# Patient Record
Sex: Male | Born: 1955 | Race: Black or African American | Hispanic: No | Marital: Married | Smoking: Current every day smoker
Health system: Southern US, Community
[De-identification: ages and names within clinical notes are randomized; demographics above are authoritative.]

---

## 2014-02-14 ENCOUNTER — Ambulatory Visit (INDEPENDENT_AMBULATORY_CARE_PROVIDER_SITE_OTHER): Payer: Federal, State, Local not specified - PPO | Admitting: Family Medicine

## 2014-02-14 ENCOUNTER — Ambulatory Visit (INDEPENDENT_AMBULATORY_CARE_PROVIDER_SITE_OTHER): Payer: Federal, State, Local not specified - PPO

## 2014-02-14 VITALS — BP 88/52 | HR 78 | Temp 98.5°F | Resp 16 | Ht 71.5 in | Wt 206.0 lb

## 2014-02-14 DIAGNOSIS — M545 Low back pain: Secondary | ICD-10-CM

## 2014-02-14 DIAGNOSIS — R0602 Shortness of breath: Secondary | ICD-10-CM

## 2014-02-14 LAB — POCT URINALYSIS DIPSTICK
Bilirubin, UA: NEGATIVE
Blood, UA: NEGATIVE
Glucose, UA: NEGATIVE
Ketones, UA: NEGATIVE
Leukocytes, UA: NEGATIVE
Nitrite, UA: NEGATIVE
Protein, UA: NEGATIVE
Spec Grav, UA: 1.025
Urobilinogen, UA: 0.2
pH, UA: 5.5

## 2014-02-14 LAB — POCT CBC
Granulocyte percent: 64.9 %G (ref 37–80)
HCT, POC: 43.8 % (ref 43.5–53.7)
Hemoglobin: 14.3 g/dL (ref 14.1–18.1)
Lymph, poc: 2.2 (ref 0.6–3.4)
MCH, POC: 30.4 pg (ref 27–31.2)
MCHC: 32.7 g/dL (ref 31.8–35.4)
MCV: 93 fL (ref 80–97)
MID (cbc): 0.4 (ref 0–0.9)
MPV: 6.9 fL (ref 0–99.8)
POC Granulocyte: 4.9 (ref 2–6.9)
POC LYMPH PERCENT: 29.7 %L (ref 10–50)
POC MID %: 5.4 %M (ref 0–12)
Platelet Count, POC: 219 10*3/uL (ref 142–424)
RBC: 4.71 M/uL (ref 4.69–6.13)
RDW, POC: 14.5 %
WBC: 7.5 10*3/uL (ref 4.6–10.2)

## 2014-02-14 MED ORDER — PREDNISONE 20 MG PO TABS: 40.0000 mg | ORAL_TABLET | Freq: Every day | ORAL | Status: AC

## 2014-02-14 MED ORDER — ALBUTEROL SULFATE HFA 108 (90 BASE) MCG/ACT IN AERS: 2.0000 | INHALATION_SPRAY | Freq: Four times a day (QID) | RESPIRATORY_TRACT | Status: AC | PRN

## 2014-02-14 NOTE — Progress Notes (Signed)
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 He's [facility] and had an x-ray but the results of that are pending.  Patient denies any chest pain but does have a cough which is minimally productive. He also has some gassiness upper abdomen.  Patient's had no fever. His appetite is good and his bowel movements are regular. He's had a colonoscopy in 2012.  Patient is a smoker.  Objective: Patient is in no acute distress but I notice that he does be heavily when he moves at all. HEENT: Unremarkable with exception of some dental problems with receding gums. Chest: Decreased breath sounds with no wheezes, rhonchi, or rales Heart: Regular, heart sounds distant, no murmur heard Extremities: No edema Abdomen: Soft nontender with some fullness in the right upper quadrant but no definite mass or HSM.  Spirometry:  WNL  [facility] reading (PRIMARY) by  Dr. Milus GlazierLauenstein:  Mild right perihilar adenopathy.  No infiltrates.  Results for orders placed or performed in visit on 02/14/14  POCT CBC  Result Value Ref Range   WBC 7.5 4.6 - 10.2 K/uL   Lymph, poc 2.2 0.6 - 3.4   POC LYMPH PERCENT 29.7 10 - 50 %L   MID (cbc) 0.4 0 - 0.9   POC MID % 5.4 0 - 12 %M   POC Granulocyte 4.9 2 - 6.9   Granulocyte percent 64.9 37 - 80 %G   RBC 4.71 4.69 - 6.13 M/uL   Hemoglobin 14.3 14.1 - 18.1 g/dL   HCT, POC 14.743.8 82.943.5 - 53.7 %   MCV 93.0 80 - 97 fL   MCH, POC 30.4 27 - 31.2 pg   MCHC 32.7 31.8 - 35.4 g/dL   RDW, POC 56.214.5 %   Platelet Count, POC 219 142 - 424 K/uL   MPV 6.9 0 - 99.8 fL  POCT urinalysis dipstick  Result Value Ref Range   Color, UA yellow    Clarity, UA clear    Glucose, UA neg    Bilirubin, UA neg    Ketones, UA neg    Spec Grav, UA 1.025    Blood, UA neg    pH, UA 5.5    Protein, UA neg    Urobilinogen, UA 0.2    Nitrite, UA neg    Leukocytes, UA Negative    This chart was scribed in my presence and  reviewed by me personally.    ICD-9-CM ICD-10-CM   1. SOB (shortness of breath) 786.05 R06.02 POCT CBC     DG Chest 2 View     predniSONE (DELTASONE) 20 MG tablet     albuterol (PROVENTIL HFA;VENTOLIN HFA) 108 (90 BASE) MCG/ACT inhaler  2. Bilateral low back pain, with sciatica presence unspecified 724.2 M54.5 POCT urinalysis dipstick     Signed, Elvina SidleKurt Arrin Pintor, MD

## 2014-02-14 NOTE — Patient Instructions (Signed)
Please return in one week to reevaluate you've breathing situation.

## 2018-08-14 ENCOUNTER — Emergency Department (HOSPITAL_BASED_OUTPATIENT_CLINIC_OR_DEPARTMENT_OTHER)
Admission: EM | Admit: 2018-08-14 | Discharge: 2018-08-14 | Disposition: A | Discharge status: Home / Self Care | Payer: Federal, State, Local not specified - PPO | Attending: Emergency Medicine | Admitting: Emergency Medicine

## 2018-08-14 ENCOUNTER — Emergency Department (HOSPITAL_BASED_OUTPATIENT_CLINIC_OR_DEPARTMENT_OTHER): Payer: Federal, State, Local not specified - PPO

## 2018-08-14 ENCOUNTER — Encounter (HOSPITAL_BASED_OUTPATIENT_CLINIC_OR_DEPARTMENT_OTHER): Payer: Self-pay | Admitting: Adult Health

## 2018-08-14 ENCOUNTER — Other Ambulatory Visit: Payer: Self-pay

## 2018-08-14 DIAGNOSIS — M5442 Lumbago with sciatica, left side: Secondary | ICD-10-CM | POA: Insufficient documentation

## 2018-08-14 DIAGNOSIS — F17210 Nicotine dependence, cigarettes, uncomplicated: Secondary | ICD-10-CM | POA: Diagnosis not present

## 2018-08-14 DIAGNOSIS — R1032 Left lower quadrant pain: Secondary | ICD-10-CM | POA: Diagnosis present

## 2018-08-14 LAB — COMPREHENSIVE METABOLIC PANEL
ALT: 12 U/L (ref 0–44)
AST: 15 U/L (ref 15–41)
Albumin: 3.6 g/dL (ref 3.5–5.0)
Alkaline Phosphatase: 83 U/L (ref 38–126)
Anion gap: 10 (ref 5–15)
BUN: 17 mg/dL (ref 8–23)
CO2: 23 mmol/L (ref 22–32)
Calcium: 8.7 mg/dL — ABNORMAL LOW (ref 8.9–10.3)
Chloride: 105 mmol/L (ref 98–111)
Creatinine, Ser: 1.23 mg/dL (ref 0.61–1.24)
GFR calc Af Amer: >60 mL/min (ref >60)
GFR calc non Af Amer: >60 mL/min (ref >60)
Glucose, Bld: 103 mg/dL — ABNORMAL HIGH (ref 70–99)
Potassium: 4.1 mmol/L (ref 3.5–5.1)
Sodium: 138 mmol/L (ref 135–145)
Total Bilirubin: 0.3 mg/dL (ref 0.3–1.2)
Total Protein: 6.9 g/dL (ref 6.5–8.1)

## 2018-08-14 LAB — CBC WITH DIFFERENTIAL/PLATELET
Abs Immature Granulocytes: 0.02 10*3/uL (ref 0.00–0.07)
Basophils Absolute: 0.1 10*3/uL (ref 0.0–0.1)
Basophils Relative: 1 %
Eosinophils Absolute: 0.2 10*3/uL (ref 0.0–0.5)
Eosinophils Relative: 4 %
HCT: 42.7 % (ref 39.0–52.0)
Hemoglobin: 13.9 g/dL (ref 13.0–17.0)
Immature Granulocytes: 0 %
Lymphocytes Relative: 37 %
Lymphs Abs: 2.4 10*3/uL (ref 0.7–4.0)
MCH: 30.7 pg (ref 26.0–34.0)
MCHC: 32.6 g/dL (ref 30.0–36.0)
MCV: 94.3 fL (ref 80.0–100.0)
Monocytes Absolute: 0.5 10*3/uL (ref 0.1–1.0)
Monocytes Relative: 7 %
Neutro Abs: 3.3 10*3/uL (ref 1.7–7.7)
Neutrophils Relative %: 51 %
Platelets: 210 10*3/uL (ref 150–400)
RBC: 4.53 MIL/uL (ref 4.22–5.81)
RDW: 13.5 % (ref 11.5–15.5)
WBC: 6.5 10*3/uL (ref 4.0–10.5)
nRBC: 0 % (ref 0.0–0.2)

## 2018-08-14 MED ORDER — SODIUM CHLORIDE 0.9 % IV BOLUS
500.0000 mL | Freq: Once | INTRAVENOUS | Status: AC
  Administered 2018-08-14: 500 mL via INTRAVENOUS

## 2018-08-14 MED ORDER — MORPHINE SULFATE (PF) 4 MG/ML IV SOLN
4.0000 mg | Freq: Once | INTRAVENOUS | Status: AC
  Administered 2018-08-14: 4 mg via INTRAVENOUS
  Filled 2018-08-14: qty 1

## 2018-08-14 MED ORDER — TRAMADOL HCL 50 MG PO TABS: 50.0000 mg | ORAL_TABLET | Freq: Four times a day (QID) | ORAL | 0 refills | Status: AC | PRN

## 2018-08-14 MED ORDER — IOHEXOL 350 MG/ML SOLN
125.0000 mL | Freq: Once | INTRAVENOUS | Status: AC | PRN
  Administered 2018-08-14: 120 mL via INTRAVENOUS

## 2018-08-14 MED ORDER — LIDOCAINE 5 % EX PTCH: 1.0000 | MEDICATED_PATCH | CUTANEOUS | 0 refills | Status: AC

## 2018-08-14 MED ORDER — METHYLPREDNISOLONE 4 MG PO TBPK: ORAL_TABLET | ORAL | 0 refills | Status: AC

## 2018-08-14 MED ORDER — CYCLOBENZAPRINE HCL 10 MG PO TABS: 10.0000 mg | ORAL_TABLET | Freq: Two times a day (BID) | ORAL | 0 refills | Status: AC | PRN

## 2018-08-14 NOTE — ED Notes (Signed)
Pt advised prescriptions can be picked up at pharmacy listed on d/c instructions. His girlfriend is driving him home

## 2018-08-14 NOTE — ED Notes (Signed)
CT waiting on CMP results prior to imaging.

## 2018-08-14 NOTE — ED Notes (Signed)
Irregular rhythm noted of monitor. EDP notified and EKG done per VORB from Dr. Billy Fischer

## 2018-08-14 NOTE — ED Provider Notes (Signed)
[facility] EMERGENCY DEPARTMENT Provider Note   CSN: [id number] Arrival date & time: 08/14/18  1807    History   Chief Complaint Chief Complaint  Patient presents with  . Back Pain    HPI Brian Middleton is a 63 y.o. male.     HPI   Last night was helping a friend work a car, leaned over and suddenly had pain from left flank radiating down the left leg. Goes down back and front of left leg. Moved the wrong way and pain started.  Worse with movements, better with sitting. Tried tylenol, ibuprofen and muscle relaxants without relief.  Numbness in leg, reports entire leg. Pain worse in proximal portion of leg. No weakness.  Thought it was gas at first.  Just had teeth pulled recently.   No abdominal pain, leg hurts when urinating but does not have dysuria or hesitancy.  No loss of control of bowel or bladder.  Has had diarrhea since tooth was pulled Monday because has not been eating normally.  No black or bloody stools.   No hx of pain like this before. No known medical problems.  Smoking cigarettes, considering quitting. No etoh, no other drugs (occ thc)  History reviewed. No pertinent past medical history.  There are no active problems to display for this patient.   History reviewed. No pertinent surgical history.      Home Medications    Prior to Admission medications   Medication Sig Start Date End Date Taking? Authorizing Provider  albuterol (PROVENTIL HFA;VENTOLIN HFA) 108 (90 BASE) MCG/ACT inhaler Inhale 2 puffs into the lungs every 6 (six) hours as needed for wheezing or shortness of breath. 02/14/14   Elvina SidleLauenstein, Kurt, MD  cyclobenzaprine (FLEXERIL) 10 MG tablet Take 1 tablet (10 mg total) by mouth 2 (two) times daily as needed for muscle spasms. 08/14/18   Alvira MondaySchlossman, Dniya Neuhaus, MD  lidocaine (LIDODERM) 5 % Place 1 patch onto the skin daily. Remove & Discard patch within 12 hours or as directed by MD 08/14/18   Alvira MondaySchlossman, Leilyn Frayre, MD  methylPREDNISolone  (MEDROL DOSEPAK) 4 MG TBPK tablet See directions on package 08/14/18   Alvira MondaySchlossman, Daila Elbert, MD  predniSONE (DELTASONE) 20 MG tablet Take 2 tablets (40 mg total) by mouth daily. 02/14/14   Elvina SidleLauenstein, Kurt, MD  traMADol (ULTRAM) 50 MG tablet Take 1 tablet (50 mg total) by mouth every 6 (six) hours as needed. 08/14/18   Alvira MondaySchlossman, Leslieann Whisman, MD    Family History Family History  Problem Relation Age of Onset  . Diabetes Mother     Social History Social History   Tobacco Use  . Smoking status: Current Every Day Smoker    Packs/day: 0.25    Types: Cigarettes  Substance Use Topics  . Alcohol use: No    Alcohol/week: 0.0 standard drinks  . Drug use: No     Allergies   Penicillins   Review of Systems Review of Systems  Constitutional: Negative for fever.  Respiratory: Negative for cough and shortness of breath.   Cardiovascular: Negative for chest pain.  Gastrointestinal: Positive for diarrhea. Negative for abdominal pain, constipation, nausea and vomiting.  Genitourinary: Negative for difficulty urinating and dysuria.  Musculoskeletal: Positive for back pain.  Skin: Negative for rash.  Neurological: Positive for numbness. Negative for weakness.     Physical Exam Updated Vital Signs BP (!) 154/85   Pulse (!) 59   Temp 98.5 F (36.9 C) (Oral)   Resp 16   Ht 6\' 2"  (1.88 m)  Wt 94.3 kg   SpO2 96%   BMI 26.71 kg/m   Physical Exam Vitals signs and nursing note reviewed.  Constitutional:      General: He is not in acute distress.    Appearance: He is well-developed. He is not diaphoretic.  HENT:     Head: Normocephalic and atraumatic.  Eyes:     Conjunctiva/sclera: Conjunctivae normal.  Neck:     Musculoskeletal: Normal range of motion.  Cardiovascular:     Rate and Rhythm: Normal rate and regular rhythm.     Heart sounds: Normal heart sounds. No murmur. No friction rub. No gallop.      Comments: Left DP pulse nonpalpable on initial eval-later eval with more distal DP  pulse palpable but less than right sided Normal bilateral PT pulses Feet warm, normal cap refill Pulmonary:     Effort: Pulmonary effort is normal. No respiratory distress.     Breath sounds: Normal breath sounds. No wheezing or rales.  Abdominal:     General: There is no distension.     Palpations: Abdomen is soft.     Tenderness: There is no abdominal tenderness. There is no guarding.  Skin:    General: Skin is warm and dry.  Neurological:     Mental Status: He is alert and oriented to person, place, and time.     GCS: GCS eye subscore is 4. GCS verbal subscore is 5. GCS motor subscore is 6.     Sensory: Sensation is intact. No sensory deficit.     Motor: Motor function is intact. No weakness.      ED Treatments / Results  Labs (all labs ordered are listed, but only abnormal results are displayed) Labs Reviewed  COMPREHENSIVE METABOLIC PANEL - Abnormal; Notable for the following components:      Result Value   Glucose, Bld 103 (*)    Calcium 8.7 (*)    All other components within normal limits  CBC WITH DIFFERENTIAL/PLATELET    EKG EKG Interpretation  Date/Time:  Saturday August 14 2018 20:09:22 EDT Ventricular Rate:  63 PR Interval:    QRS Duration: 88 QT Interval:  408 QTC Calculation: 418 R Axis:   73 Text Interpretation:  Sinus arrhythmia Borderline prolonged PR interval Consider left ventricular hypertrophy No previous ECGs available Confirmed by Alvira MondaySchlossman, Kaven Cumbie ([id number]) on 08/14/2018 10:14:37 PM   Radiology Ct Angio Aortobifemoral W And/or Wo Contrast  Result Date: 08/14/2018 CLINICAL DATA:  63 year old male with 7 onset of left leg pain and coldness. Also left flank pain. EXAM: CT ANGIOGRAPHY OF ABDOMINAL AORTA WITH ILIOFEMORAL RUNOFF TECHNIQUE: Multidetector CT imaging of the abdomen, pelvis and lower extremities was performed using the standard protocol during bolus administration of intravenous contrast. Multiplanar CT image reconstructions and MIPs were  obtained to evaluate the vascular anatomy. CONTRAST:  120mL OMNIPAQUE IOHEXOL 350 MG/ML SOLN COMPARISON:  None. FINDINGS: VASCULAR Aorta: Normal caliber aorta without aneurysm, dissection, vasculitis or significant stenosis. Celiac: Patent without evidence of aneurysm, dissection, vasculitis or significant stenosis. There is an accessory left hepatic artery from left gastric artery. SMA: Patent without evidence of aneurysm, dissection, vasculitis or significant stenosis. There is an accessory right hepatic artery from the SMA. Renals: Both renal arteries are patent without evidence of aneurysm, dissection, vasculitis, fibromuscular dysplasia or significant stenosis. IMA: Patent without evidence of aneurysm, dissection, vasculitis or significant stenosis. RIGHT Lower Extremity Inflow: Mild atherosclerotic calcification of the iliac vessels. The internal and external iliac arteries as well as the common iliac  artery are patent. Outflow: The common femoral artery, superficial and deep femoral arteries are patent. There is slight irregularity of the distal superficial femoral artery which may represent mild noncalcified plaque. The vessel is patent. The popliteal artery and its trifurcation are patent. Runoff: Patent three vessel runoff to the ankle. The plantar artery and dorsalis pedis artery are patent. LEFT Lower Extremity Inflow: Mild atherosclerotic plaques. The common, internal, and external iliac arteries are patent. Outflow: The common, deep, and superficial femoral arteries patent. There is irregularity of the distal superficial femoral artery which may represent mild plaque. The vessel remains patent. The popliteal artery and its trifurcation are patent. Runoff: Patent three vessel runoff to the ankle. The plantar artery is patent. There appears to be diminished flow in the left dorsalis pedis artery. Evaluation of the arteries of the foot is however limited on this study. Veins: No obvious venous abnormality  within the limitations of this arterial phase study. Review of the MIP images confirms the above findings. NON-VASCULAR Lower chest: The visualized lung bases are clear. No intra-abdominal free air or free fluid. Hepatobiliary: No focal liver abnormality is seen. No gallstones, gallbladder wall thickening, or biliary dilatation. Pancreas: Unremarkable. No pancreatic ductal dilatation or surrounding inflammatory changes. Spleen: Normal in size without focal abnormality. Adrenals/Urinary Tract: The adrenal glands are unremarkable. There is no hydronephrosis on either side. Subcentimeter hypodense lesion in the inferior pole of the right kidney is not characterized. The visualized ureters and urinary bladder appear unremarkable. Stomach/Bowel: There is sigmoid diverticulosis without active inflammatory changes. There is no bowel obstruction or active inflammation. The appendix is normal. Lymphatic: No adenopathy. Reproductive: Mild enlargement of the prostate gland. The seminal vesicles are symmetric. Other: There is thickened appearance of the soft tissues of the penis. Clinical correlation is recommended to evaluate for an inflammatory or infectious process. Musculoskeletal: Mild degenerative changes of the spine as well as degenerative changes of the first MTP joint bilaterally. No acute osseous pathology. IMPRESSION: 1. Patent three-vessel runoff to the bilateral ankles. There appears to be diminished flow in the left dorsalis pedis artery. Evaluation of the arteries of the foot is however limited on this study. Clinical correlation is recommended. 2. No acute intra-abdominal or pelvic pathology. No CT evidence of aortic dissection or aneurysm. 3. Thickened appearance of the soft tissues of the penis. Clinical correlation is recommended to evaluate for an inflammatory or infectious process. 4. Sigmoid diverticulosis. No bowel obstruction or active inflammation. Normal appendix. Electronically Signed   By: Elgie CollardArash   Radparvar M.D.   On: 08/14/2018 22:09    Procedures Procedures (including critical care time)  Medications Ordered in ED Medications  sodium chloride 0.9 % bolus 500 mL (0 mLs Intravenous Stopped 08/14/18 2000)  morphine 4 MG/ML injection 4 mg (4 mg Intravenous Given 08/14/18 1929)  iohexol (OMNIPAQUE) 350 MG/ML injection 125 mL (120 mLs Intravenous Contrast Given 08/14/18 2032)  morphine 4 MG/ML injection 4 mg (4 mg Intravenous Given 08/14/18 2110)     Initial Impression / Assessment and Plan / ED Course  I have reviewed the triage vital signs and the nursing notes.  Pertinent labs & imaging results that were available during my care of the patient were reviewed by me and considered in my medical decision making (see chart for details).        63yo male with history of smoking presents with left flank/lower back pain with radiation down left leg and left leg numbness. On my initial eval, patient rocking back and  forth with colicky appearing pain, unable to palpate left sided DP (although foot appears perfused) and given this presentation ordered CTA to evaluate for signs of dissection and LLE ischemia.   CTA shows no sign of dissection, normal 3 vessel run off with decrease in flow in the left DP artery.  Discussed with vascular surgery. Pt with normal perfusion to foot, palpable PT pulse, no specific foot pain, does not require vascular surgery follow up. On reeeval, able to palpate more distal DP pulse, although less than right side, and suspect this may be his baseline.  CT shows thickened appearance of soft tissues of penis, pt denies any concerns and declines exam.    Regarding back pain, in light of CT, hx and physical, suspect likely disc herniation as etiology of pain. Reports normal sensation on my exam, has good strength. Denies any urinary retention or overflow incontinence, stool incontinence, saddle anesthesia, fever, IV drug use, trauma, and have low suspicion suspicion for  cauda equina, fracture, epidural abscess, or vertebral osteomyelitis.  Given rx medrol dose pack, flexeril, tramadol, lidocaine patch and recommend ibuprofen/tylenol OTC.  Reviewed in [place] drug database, did have recent rx for 3 days of hydrocodone for wrist pain, however given severity of pain at this time, feel rx for tramadol is reasonable. Discussed risks in detail. Recommend PCP follow up for PT and further pain control. Patient discharged in stable condition with understanding of reasons to return.   Final Clinical Impressions(s) / ED Diagnoses   Final diagnoses:  Acute left-sided low back pain with left-sided sciatica    ED Discharge Orders         Ordered    methylPREDNISolone (MEDROL DOSEPAK) 4 MG TBPK tablet     08/14/18 2310    traMADol (ULTRAM) 50 MG tablet  Every 6 hours PRN     08/14/18 2310    cyclobenzaprine (FLEXERIL) 10 MG tablet  2 times daily PRN     08/14/18 2310    lidocaine (LIDODERM) 5 %  Every 24 hours     08/14/18 2310           Gareth Morgan, MD 08/15/18 1336

## 2018-08-14 NOTE — ED Notes (Signed)
ED Provider at bedside. 

## 2018-08-14 NOTE — ED Triage Notes (Signed)
PREsents with one day of left flank pain that radiates into the left leg and is described as aching and numbness. The pain began last night after moving the wrong way. He has no relief with ibuprofen and muscle relaxers

## 2018-08-14 NOTE — ED Notes (Signed)
Pt given urinal.

## 2018-10-08 ENCOUNTER — Emergency Department (HOSPITAL_BASED_OUTPATIENT_CLINIC_OR_DEPARTMENT_OTHER)
Admission: EM | Admit: 2018-10-08 | Discharge: 2018-10-08 | Disposition: A | Discharge status: Home / Self Care | Payer: Federal, State, Local not specified - PPO | Attending: Emergency Medicine | Admitting: Emergency Medicine

## 2018-10-08 ENCOUNTER — Other Ambulatory Visit: Payer: Self-pay

## 2018-10-08 ENCOUNTER — Encounter (HOSPITAL_BASED_OUTPATIENT_CLINIC_OR_DEPARTMENT_OTHER): Payer: Self-pay

## 2018-10-08 ENCOUNTER — Emergency Department (HOSPITAL_BASED_OUTPATIENT_CLINIC_OR_DEPARTMENT_OTHER): Payer: Federal, State, Local not specified - PPO

## 2018-10-08 DIAGNOSIS — Y92009 Unspecified place in unspecified non-institutional (private) residence as the place of occurrence of the external cause: Secondary | ICD-10-CM | POA: Insufficient documentation

## 2018-10-08 DIAGNOSIS — F17210 Nicotine dependence, cigarettes, uncomplicated: Secondary | ICD-10-CM | POA: Insufficient documentation

## 2018-10-08 DIAGNOSIS — R070 Pain in throat: Secondary | ICD-10-CM | POA: Diagnosis present

## 2018-10-08 DIAGNOSIS — Y9389 Activity, other specified: Secondary | ICD-10-CM | POA: Diagnosis not present

## 2018-10-08 DIAGNOSIS — X58XXXA Exposure to other specified factors, initial encounter: Secondary | ICD-10-CM | POA: Diagnosis not present

## 2018-10-08 DIAGNOSIS — Z79899 Other long term (current) drug therapy: Secondary | ICD-10-CM | POA: Insufficient documentation

## 2018-10-08 DIAGNOSIS — Y999 Unspecified external cause status: Secondary | ICD-10-CM | POA: Insufficient documentation

## 2018-10-08 DIAGNOSIS — T17208A Unspecified foreign body in pharynx causing other injury, initial encounter: Secondary | ICD-10-CM

## 2018-10-08 DIAGNOSIS — Z20828 Contact with and (suspected) exposure to other viral communicable diseases: Secondary | ICD-10-CM | POA: Insufficient documentation

## 2018-10-08 LAB — CBC WITH DIFFERENTIAL/PLATELET
Abs Immature Granulocytes: 0.02 10*3/uL (ref 0.00–0.07)
Basophils Absolute: 0.1 10*3/uL (ref 0.0–0.1)
Basophils Relative: 1 %
Eosinophils Absolute: 0.3 10*3/uL (ref 0.0–0.5)
Eosinophils Relative: 4 %
HCT: 41.6 % (ref 39.0–52.0)
Hemoglobin: 13.7 g/dL (ref 13.0–17.0)
Immature Granulocytes: 0 %
Lymphocytes Relative: 33 %
Lymphs Abs: 2.4 10*3/uL (ref 0.7–4.0)
MCH: 30.9 pg (ref 26.0–34.0)
MCHC: 32.9 g/dL (ref 30.0–36.0)
MCV: 93.7 fL (ref 80.0–100.0)
Monocytes Absolute: 0.7 10*3/uL (ref 0.1–1.0)
Monocytes Relative: 10 %
Neutro Abs: 3.7 10*3/uL (ref 1.7–7.7)
Neutrophils Relative %: 52 %
Platelets: 228 10*3/uL (ref 150–400)
RBC: 4.44 MIL/uL (ref 4.22–5.81)
RDW: 13.5 % (ref 11.5–15.5)
WBC: 7.1 10*3/uL (ref 4.0–10.5)
nRBC: 0 % (ref 0.0–0.2)

## 2018-10-08 LAB — COMPREHENSIVE METABOLIC PANEL
ALT: 12 U/L (ref 0–44)
AST: 18 U/L (ref 15–41)
Albumin: 3.8 g/dL (ref 3.5–5.0)
Alkaline Phosphatase: 89 U/L (ref 38–126)
Anion gap: 7 (ref 5–15)
BUN: 20 mg/dL (ref 8–23)
CO2: 24 mmol/L (ref 22–32)
Calcium: 8.9 mg/dL (ref 8.9–10.3)
Chloride: 108 mmol/L (ref 98–111)
Creatinine, Ser: 1 mg/dL (ref 0.61–1.24)
GFR calc Af Amer: >60 mL/min (ref >60)
GFR calc non Af Amer: >60 mL/min (ref >60)
Glucose, Bld: 90 mg/dL (ref 70–99)
Potassium: 3.7 mmol/L (ref 3.5–5.1)
Sodium: 139 mmol/L (ref 135–145)
Total Bilirubin: 0.2 mg/dL — ABNORMAL LOW (ref 0.3–1.2)
Total Protein: 7.2 g/dL (ref 6.5–8.1)

## 2018-10-08 LAB — SARS CORONAVIRUS 2 BY RT PCR (HOSPITAL ORDER, PERFORMED IN ~~LOC~~ HOSPITAL LAB): SARS Coronavirus 2: NEGATIVE

## 2018-10-08 MED ORDER — IOHEXOL 300 MG/ML  SOLN
100.0000 mL | Freq: Once | INTRAMUSCULAR | Status: AC | PRN
  Administered 2018-10-08: 75 mL via INTRAVENOUS

## 2018-10-08 NOTE — ED Provider Notes (Signed)
Patient here transferred from [facility] for evaluation of retained foreign body.  Symptoms began yesterday after eating a chicken wing.  Since then has had left-sided neck discomfort and swelling, difficulty swallowing, drooling due to discomfort.  Denies shortness of breath or chest pains.  CT scan today shows possible tiny 3 mm retained chicken bone/foreign body in the right vallecula but otherwise negative.  ENT was consulted and the patient was transferred for further evaluation. Physical Exam  BP 125/77   Pulse 79   Temp 98.3 F (36.8 C) (Oral)   Resp 16   Ht 6\' 2"  (1.88 m)   Wt 90.7 kg   SpO2 96%   BMI 25.68 kg/m   Physical Exam Vitals signs and nursing note reviewed.  Constitutional:      General: He is not in acute distress.    Appearance: He is well-developed.  HENT:     Head: Normocephalic and atraumatic.     Mouth/Throat:     Comments: Tolerating secretions without difficulty.  No abnormal phonation.  No trismus.  Unable to visualize any abnormalities in the posterior oropharynx. Eyes:     General:        Right eye: No discharge.        Left eye: No discharge.     Conjunctiva/sclera: Conjunctivae normal.  Neck:     Musculoskeletal: Neck supple. Muscular tenderness present.     Vascular: No JVD.     Trachea: No tracheal deviation.     Comments: Mild left-sided neck tenderness. Cardiovascular:     Rate and Rhythm: Normal rate and regular rhythm.  Pulmonary:     Effort: Pulmonary effort is normal.     Breath sounds: Normal breath sounds.  Abdominal:     General: There is no distension.  Skin:    General: Skin is warm and dry.     Findings: No erythema.  Neurological:     Mental Status: He is alert.  Psychiatric:        Behavior: Behavior normal.    MDM  Spoke with Dr. Janace Hoard who will see and evaluate the patient emergently in the ED.  11:15PM Dr. Janace Hoard has examined the patient. Examination is reassuring and there is no indication for emergent  operative procedure at this time.  Likely has some localized irritation.  He will follow-up in the office in the next 1 to 2 days if symptoms persist, sooner if symptoms worsen.  He is tolerating sips of fluid in the ED.  Tolerating secretions without difficulty discussed strict ED return precautions.  Patient and significant other verbalized understanding of and agreement with plan and patient stable for discharge home at this time       Debroah Baller 10/08/18 2329    Virgel Manifold, MD 10/10/18 [phone]

## 2018-10-08 NOTE — Discharge Instructions (Addendum)
You were seen today by the ENT specialist Dr. Janace Hoard in the emergency department.  Your physical examination was reassuring and there does not appear to be any need to retrieve any foreign bodies in your throat today.  You can take 1 to 2 tablets of Tylenol (350mg -1000mg  depending on the dose) every 6 hours as needed for pain.  Do not exceed 4000 mg of Tylenol daily.  If your pain persists you can take a doses of ibuprofen in between doses of Tylenol.  I usually recommend 400 to 600 mg of ibuprofen every 6 hours.  Take this with food to avoid upset stomach issues.  Drink plenty fluids and get plenty of rest.  Eat a diet of warm soft foods for the next few days.  Follow-up with Dr. Janace Hoard in the office in the next 48 hours or so if your symptoms do not improve.  Return to the emergency department if any concerning signs or symptoms develop such as high fevers, persistent vomiting, severe pain, difficulty breathing or swallowing.

## 2018-10-08 NOTE — ED Notes (Signed)
Discharge instructions and follow up care discussed with pt and significant other at bedside. Both verbalized understanding with no questions at this time.

## 2018-10-08 NOTE — ED Provider Notes (Signed)
[facility] EMERGENCY DEPARTMENT Provider Note   CSN: [id number] Arrival date & time: 10/08/18  1459     History   Chief Complaint Chief Complaint  Patient presents with   Neck swelling    HPI Brian Middleton is a 63 y.o. male.     HPI   63 year old male with no significant medical history presents with concern for left throat pain and swelling after eating chicken last night.  Reports eating chicken with bones in it last night, immediately felt pain in the back of his throat, however it was not severe, and took some ibuprofen and went to bed.  When he woke up this morning, left-sided neck pain and swelling was worse.  Reports significant pain with swallowing on the left side.  Reports it is difficult to swallow, does not have significant drooling.  Reports he has no shortness of breath.  History reviewed. No pertinent past medical history.  There are no active problems to display for this patient.   History reviewed. No pertinent surgical history.      Home Medications    Prior to Admission medications   Medication Sig Start Date End Date Taking? Authorizing Provider  albuterol (PROVENTIL HFA;VENTOLIN HFA) 108 (90 BASE) MCG/ACT inhaler Inhale 2 puffs into the lungs every 6 (six) hours as needed for wheezing or shortness of breath. 02/14/14   Elvina SidleLauenstein, Kurt, MD  cyclobenzaprine (FLEXERIL) 10 MG tablet Take 1 tablet (10 mg total) by mouth 2 (two) times daily as needed for muscle spasms. 08/14/18   Alvira MondaySchlossman, Rodney Yera, MD  lidocaine (LIDODERM) 5 % Place 1 patch onto the skin daily. Remove & Discard patch within 12 hours or as directed by MD 08/14/18   Alvira MondaySchlossman, Tremain Rucinski, MD  methylPREDNISolone (MEDROL DOSEPAK) 4 MG TBPK tablet See directions on package 08/14/18   Alvira MondaySchlossman, Lannie Heaps, MD  predniSONE (DELTASONE) 20 MG tablet Take 2 tablets (40 mg total) by mouth daily. 02/14/14   Elvina SidleLauenstein, Kurt, MD  traMADol (ULTRAM) 50 MG tablet Take 1 tablet (50 mg total) by mouth every 6  (six) hours as needed. 08/14/18   Alvira MondaySchlossman, Sheena Donegan, MD    Family History Family History  Problem Relation Age of Onset   Diabetes Mother     Social History Social History   Tobacco Use   Smoking status: Current Every Day Smoker    Packs/day: 0.25    Types: Cigarettes   Smokeless tobacco: Never Used  Substance Use Topics   Alcohol use: No    Alcohol/week: 0.0 standard drinks   Drug use: No     Allergies   Penicillins   Review of Systems Review of Systems  Constitutional: Negative for fever.  HENT: Positive for sore throat and trouble swallowing. Negative for voice change.   Eyes: Negative for visual disturbance.  Respiratory: Negative for shortness of breath.   Cardiovascular: Negative for chest pain.  Gastrointestinal: Negative for abdominal pain.  Genitourinary: Negative for difficulty urinating.  Musculoskeletal: Positive for neck pain.  Skin: Negative for rash.  Neurological: Negative for syncope and headaches.     Physical Exam Updated Vital Signs BP 126/83    Pulse 63    Temp 98.3 F (36.8 C) (Oral)    Resp 19    Ht 6\' 2"  (1.88 m)    Wt 90.7 kg    SpO2 99%    BMI 25.68 kg/m   Physical Exam Vitals signs and nursing note reviewed.  Constitutional:      General: He is not in acute  distress.    Appearance: He is well-developed. He is not diaphoretic.  HENT:     Head: Normocephalic and atraumatic.     Mouth/Throat:     Mouth: Mucous membranes are moist.     Pharynx: No oropharyngeal exudate or posterior oropharyngeal erythema.  Eyes:     Conjunctiva/sclera: Conjunctivae normal.  Neck:     Musculoskeletal: Normal range of motion. Muscular tenderness (left anterior cervical area, lateral to trachea with tenderness, no crepitus, no significant swelling) present.  Cardiovascular:     Rate and Rhythm: Normal rate and regular rhythm.  Pulmonary:     Effort: Pulmonary effort is normal. No respiratory distress.  Skin:    General: Skin is warm and dry.    Neurological:     Mental Status: He is alert and oriented to person, place, and time.      ED Treatments / Results  Labs (all labs ordered are listed, but only abnormal results are displayed) Labs Reviewed  COMPREHENSIVE METABOLIC PANEL - Abnormal; Notable for the following components:      Result Value   Total Bilirubin 0.2 (*)    All other components within normal limits  SARS CORONAVIRUS 2 (HOSPITAL ORDER, PERFORMED IN [facility])  CBC WITH DIFFERENTIAL/PLATELET    EKG None  Radiology Ct Soft Tissue Neck W Contrast  Result Date: 10/08/2018 CLINICAL DATA:  63 year old male with severe left neck pain after swallowing a chicken bone. EXAM: CT NECK WITH CONTRAST TECHNIQUE: Multidetector CT imaging of the neck was performed using the standard protocol following the bolus administration of intravenous contrast. CONTRAST:  51mL OMNIPAQUE IOHEXOL 300 MG/ML  SOLN COMPARISON:  Report of chest CTA 03/26/2014 (no images available). FINDINGS: Pharynx and larynx: Negative larynx. Pharyngeal soft tissue contours are also within normal limits. Parapharyngeal and retropharyngeal spaces are within normal limits. To possible tiny 3 millimeter linear retained foreign body in the right vallecula on series 3, image 80 and sagittal image 60. But no other radiopaque foreign body identified. Salivary glands: Negative sublingual space. Submandibular glands and parotid glands are within normal limits. Thyroid: Negative. Lymph nodes: An area of clinical concern is marked along the left neck on series 3, image 78. No regional mass or inflammation. No cervical lymphadenopathy, bilateral lymph nodes are symmetric and within normal limits. Vascular: Suboptimal intravascular contrast bolus but the major vascular structures in the neck and at the skull base appear patent. Limited intracranial: Negative. Visualized orbits: Negative. Mastoids and visualized paranasal sinuses: Clear. Skeleton: Absent  dentition. Cervical spine degeneration. No acute osseous abnormality identified. Upper chest: Mild upper lobe scarring and atelectasis. Negative visible mediastinum. Negative visible thoracic esophagus. Normal visible axillary lymph nodes. IMPRESSION: 1. Possible tiny 3 mm retained chicken bone/foreign body in the right vallecula (see sagittal image 60). 2. But no other radiopaque foreign body identified. And negative Neck CT otherwise. Electronically Signed   By: Odessa Fleming M.D.   On: 10/08/2018 18:02    Procedures Procedures (including critical care time)  Medications Ordered in ED Medications  iohexol (OMNIPAQUE) 300 MG/ML solution 100 mL (75 mLs Intravenous Contrast Given 10/08/18 1736)     Initial Impression / Assessment and Plan / ED Course  I have reviewed the triage vital signs and the nursing notes.  Pertinent labs & imaging results that were available during my care of the patient were reviewed by me and considered in my medical decision making (see chart for details).        63 year old male with  no significant medical history presents with concern for left throat pain and swelling after eating chicken last night.  Patient with severe tenderness on exam, CT neck was obtained which showed suspected 3 mm foreign body in the vallecula.  Consulted Dr. Janace Hoard, ENT, we discussed options including transfer for further evaluation, or continued close monitoring with return in 24 hours if not improved, or follow-up with ear nose and throat on Monday if symptoms not worsening.  Discussed options with patient, who reports that due to the pain, and desire for further evaluation, we will transfer to [facility] for ENT eval.  Final Clinical Impressions(s) / ED Diagnoses   Final diagnoses:  Foreign body in pharynx, initial encounter    ED Discharge Orders    None       Gareth Morgan, MD 10/09/18 0028

## 2018-10-08 NOTE — ED Triage Notes (Signed)
Pt c/o swelling to left side of neck that started yesterday am after eating chicken the night before that he felt scraped the side of his throat-NAD-steady gait

## 2018-10-08 NOTE — ED Notes (Signed)
Patient transported to CT 

## 2018-10-08 NOTE — ED Notes (Signed)
ED Provider at bedside. 

## 2018-10-08 NOTE — Consult Note (Signed)
Reason for Consult:FB pharynx Referring Physician: er  Brian Middleton is an 63 y.o. male.  HPI: hx of eating chicken on Wednesday and started having pain yesterday. He has pain in the left upper throat.He did not feel a pain at the time of eating the chicken only 36-48 hours later  He can still swallow. He had CT scan in [facility] and had a possibloe 3 mm FB in the right base of tongue/vallecula.   History reviewed. No pertinent past medical history.  History reviewed. No pertinent surgical history.  Family History  Problem Relation Age of Onset  . Diabetes Mother     Social History:  reports that he has been smoking cigarettes. He has been smoking about 0.25 packs per day. He has never used smokeless tobacco. He reports that he does not drink alcohol or use drugs.  Allergies:  Allergies  Allergen Reactions  . Penicillins Hives    Medications: I have reviewed the patient's current medications.  Results for orders placed or performed during the hospital encounter of 10/08/18 (from the past 48 hour(s))  CBC with Differential     Status: None   Collection Time: 10/08/18  4:28 PM  Result Value Ref Range   WBC 7.1 4.0 - 10.5 K/uL   RBC 4.44 4.22 - 5.81 MIL/uL   Hemoglobin 13.7 13.0 - 17.0 g/dL   HCT 24.8 25.0 - 03.7 %   MCV 93.7 80.0 - 100.0 fL   MCH 30.9 26.0 - 34.0 pg   MCHC 32.9 30.0 - 36.0 g/dL   RDW 04.8 88.9 - 16.9 %   Platelets 228 150 - 400 K/uL   nRBC 0.0 0.0 - 0.2 %   Neutrophils Relative % 52 %   Neutro Abs 3.7 1.7 - 7.7 K/uL   Lymphocytes Relative 33 %   Lymphs Abs 2.4 0.7 - 4.0 K/uL   Monocytes Relative 10 %   Monocytes Absolute 0.7 0.1 - 1.0 K/uL   Eosinophils Relative 4 %   Eosinophils Absolute 0.3 0.0 - 0.5 K/uL   Basophils Relative 1 %   Basophils Absolute 0.1 0.0 - 0.1 K/uL   Immature Granulocytes 0 %   Abs Immature Granulocytes 0.02 0.00 - 0.07 K/uL    Comment: Performed at [facility], [address] [place], [place] [address]   Comprehensive metabolic panel     Status: Abnormal   Collection Time: 10/08/18  4:28 PM  Result Value Ref Range   Sodium 139 135 - 145 mmol/L   Potassium 3.7 3.5 - 5.1 mmol/L   Chloride 108 98 - 111 mmol/L   CO2 24 22 - 32 mmol/L   Glucose, Bld 90 70 - 99 mg/dL   BUN 20 8 - 23 mg/dL   Creatinine, Ser 8.82 0.61 - 1.24 mg/dL   Calcium 8.9 8.9 - 80.0 mg/dL   Total Protein 7.2 6.5 - 8.1 g/dL   Albumin 3.8 3.5 - 5.0 g/dL   AST 18 15 - 41 U/L   ALT 12 0 - 44 U/L   Alkaline Phosphatase 89 38 - 126 U/L   Total Bilirubin 0.2 (L) 0.3 - 1.2 mg/dL   GFR calc non Af Amer >60 >60 mL/min   GFR calc Af Amer >60 >60 mL/min   Anion gap 7 5 - 15    Comment: Performed at [facility], [address] [place], [place] [address]  SARS Coronavirus 2 [facility] order, Performed in [facility] hospital lab) Nasopharyngeal Nasopharyngeal  Swab     Status: None   Collection Time: 10/08/18  6:45 PM   Specimen: Nasopharyngeal Swab  Result Value Ref Range   SARS Coronavirus 2 NEGATIVE NEGATIVE    Comment: (NOTE) If result is NEGATIVE SARS-CoV-2 target nucleic acids are NOT DETECTED. The SARS-CoV-2 RNA is generally detectable in upper and lower  respiratory specimens during the acute phase of infection. The lowest  concentration of SARS-CoV-2 viral copies this assay can detect is 250  copies / mL. A negative result does not preclude SARS-CoV-2 infection  and should not be used as the sole basis for treatment or other  patient management decisions.  A negative result may occur with  improper specimen collection / handling, submission of specimen other  than nasopharyngeal swab, presence of viral mutation(s) within the  areas targeted by this assay, and inadequate number of viral copies  (<250 copies / mL). A negative result must be combined with clinical  observations, patient history, and epidemiological information. If result is POSITIVE SARS-CoV-2 target nucleic acids are DETECTED. The  SARS-CoV-2 RNA is generally detectable in upper and lower  respiratory specimens dur ing the acute phase of infection.  Positive  results are indicative of active infection with SARS-CoV-2.  Clinical  correlation with patient history and other diagnostic information is  necessary to determine patient infection status.  Positive results do  not rule out bacterial infection or co-infection with other viruses. If result is PRESUMPTIVE POSTIVE SARS-CoV-2 nucleic acids MAY BE PRESENT.   A presumptive positive result was obtained on the submitted specimen  and confirmed on repeat testing.  While 2019 novel coronavirus  (SARS-CoV-2) nucleic acids may be present in the submitted sample  additional confirmatory testing may be necessary for epidemiological  and / or clinical management purposes  to differentiate between  SARS-CoV-2 and other Sarbecovirus currently known to infect humans.  If clinically indicated additional testing with an alternate test  methodology [phone]) is advised. The SARS-CoV-2 RNA is generally  detectable in upper and lower respiratory sp ecimens during the acute  phase of infection. The expected result is Negative. Fact Sheet for Patients:  StrictlyIdeas.no Fact Sheet for Healthcare Providers: BankingDealers.co.za This test is not yet approved or cleared by the [place] FDA and has been authorized for detection and/or diagnosis of SARS-CoV-2 by FDA under an Emergency Use Authorization (EUA).  This EUA will remain in effect (meaning this test can be used) for the duration of the COVID-19 declaration under Section 564(b)(1) of the Act, 21 U.S.C. section 360bbb-3(b)(1), unless the authorization is terminated or revoked sooner. Performed at [facility], [address] [place], [place] [address]     Ct Soft Tissue Neck W Contrast  Result Date: 10/08/2018 CLINICAL DATA:  63 year old male with severe left  neck pain after swallowing a chicken bone. EXAM: CT NECK WITH CONTRAST TECHNIQUE: Multidetector CT imaging of the neck was performed using the standard protocol following the bolus administration of intravenous contrast. CONTRAST:  4mL OMNIPAQUE IOHEXOL 300 MG/ML  SOLN COMPARISON:  Report of chest CTA 03/26/2014 (no images available). FINDINGS: Pharynx and larynx: Negative larynx. Pharyngeal soft tissue contours are also within normal limits. Parapharyngeal and retropharyngeal spaces are within normal limits. To possible tiny 3 millimeter linear retained foreign body in the right vallecula on series 3, image 80 and sagittal image 60. But no other radiopaque foreign body identified. Salivary glands: Negative sublingual space. Submandibular glands and parotid glands are within normal limits. Thyroid: Negative. Lymph  nodes: An area of clinical concern is marked along the left neck on series 3, image 78. No regional mass or inflammation. No cervical lymphadenopathy, bilateral lymph nodes are symmetric and within normal limits. Vascular: Suboptimal intravascular contrast bolus but the major vascular structures in the neck and at the skull base appear patent. Limited intracranial: Negative. Visualized orbits: Negative. Mastoids and visualized paranasal sinuses: Clear. Skeleton: Absent dentition. Cervical spine degeneration. No acute osseous abnormality identified. Upper chest: Mild upper lobe scarring and atelectasis. Negative visible mediastinum. Negative visible thoracic esophagus. Normal visible axillary lymph nodes. IMPRESSION: 1. Possible tiny 3 mm retained chicken bone/foreign body in the right vallecula (see sagittal image 60). 2. But no other radiopaque foreign body identified. And negative Neck CT otherwise. Electronically Signed   By: Odessa FlemingH  Hall M.D.   On: 10/08/2018 18:02    ROS Blood pressure 123/83, pulse 62, temperature 98.3 F (36.8 C), temperature source Oral, resp. rate 14, height 6\' 2"  (1.88 m),  weight 90.7 kg, SpO2 99 %. Physical Exam  Constitutional: He appears well-developed and well-nourished.  HENT:  Head: Normocephalic and atraumatic.  Nose: Nose normal.  Mouth/Throat: Oropharynx is clear and moist.  No distress . He has no foreign body or irritation in the OP/OC. FOE- no evidence of mass.lesion or FB . He pushed in the tonsil area where he is hurting a no evidence of FB.stuck his tongue out and  No vallecular FB or irritation  Eyes: Pupils are equal, round, and reactive to light. Conjunctivae are normal.  Neck: Normal range of motion. Neck supple.    Assessment/Plan: Throat pain- he started hurting over 24 hours after eating the chicken. I see no evidence of FB on FOE. The pain he has is on the opposite side of the CT scan findings. I think likely he scrapped the throat with FB and now has irritation. He will follow up in 1-2 days if worse sooner if not improving in 24 hours  Brian ObeyJohn Wynn Middleton 10/08/2018, 11:03 PM

## 2018-10-08 NOTE — ED Triage Notes (Signed)
Pt is a transfer from [facility] for evaluation of  Bone in gland.

## 2020-08-14 IMAGING — CT CT ANGIOGRAPHY AOBIFEM WITHOUT AND WITH CONTRAST
2 of 13 series · 11 of 46 positions shown, 14 images · IV contrast (APPLIED)
Comparison: None.

CLINICAL DATA: 63-year-old male with 7 onset of left leg pain and
coldness. Also left flank pain.

EXAM:
CT ANGIOGRAPHY OF ABDOMINAL AORTA WITH ILIOFEMORAL RUNOFF
TECHNIQUE: Multidetector CT imaging of the abdomen, pelvis and lower
extremities was performed using the standard protocol during bolus
administration of intravenous contrast. Multiplanar CT image
reconstructions and MIPs were obtained to evaluate the vascular
anatomy.
CONTRAST:  120mL OMNIPAQUE IOHEXOL 350 MG/ML SOLN

[Series 5: runoff axial arterial · axial · arterial · 0.97mm/px · z∈[-211,+968]mm · 10 of 473 slices shown, 13 images]
[im 53/473  soft-tissue]
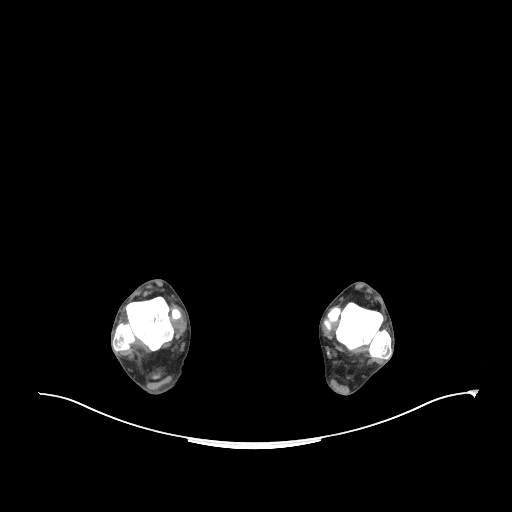
[im 53/473  bone]
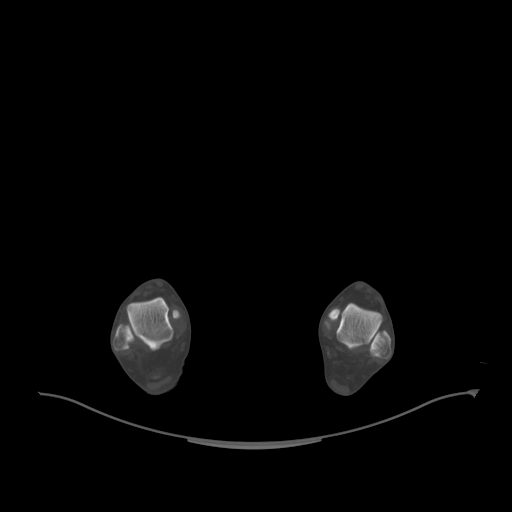
[im 105/473  soft-tissue]
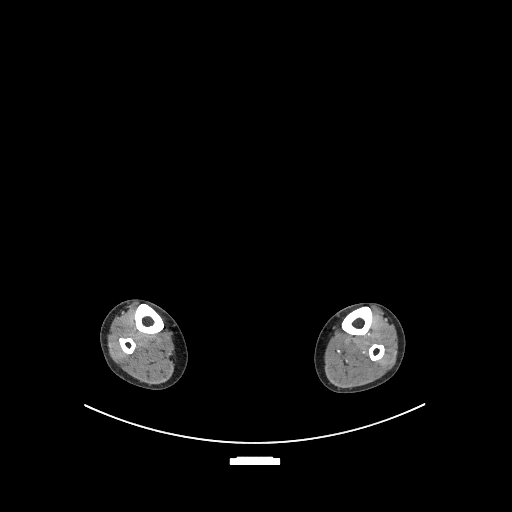
[im 158/473  soft-tissue]
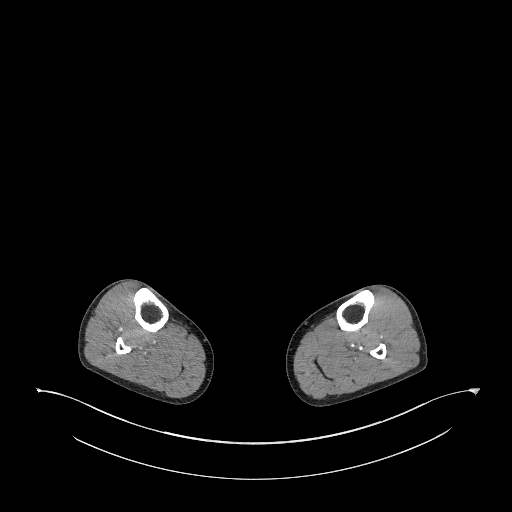
[im 210/473  soft-tissue]
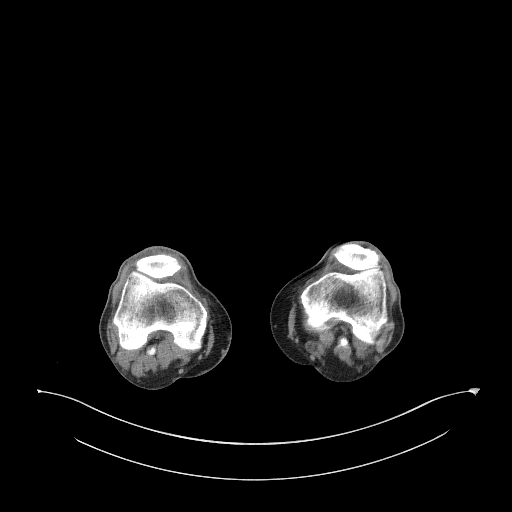
[im 263/473  soft-tissue]
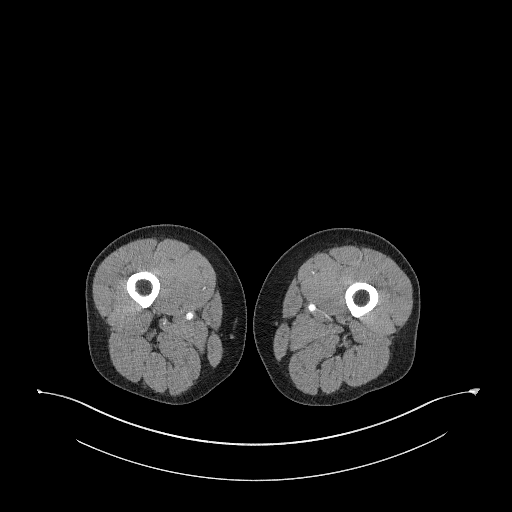
[im 315/473  soft-tissue]
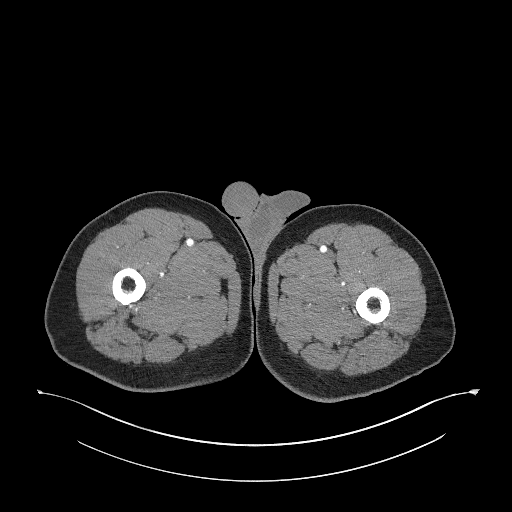
[im 368/473  soft-tissue]
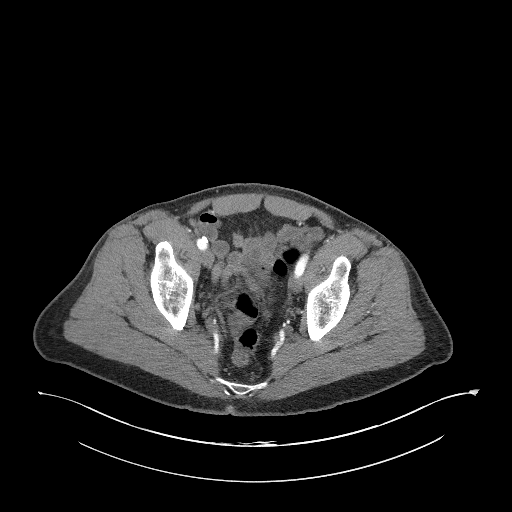
[im 368/473  lung]
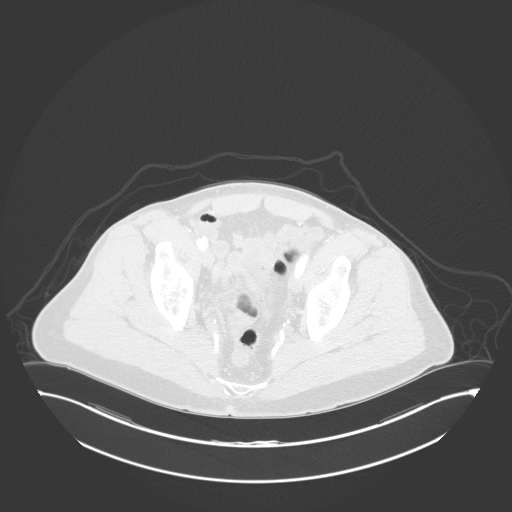
[im 394/473  lung]
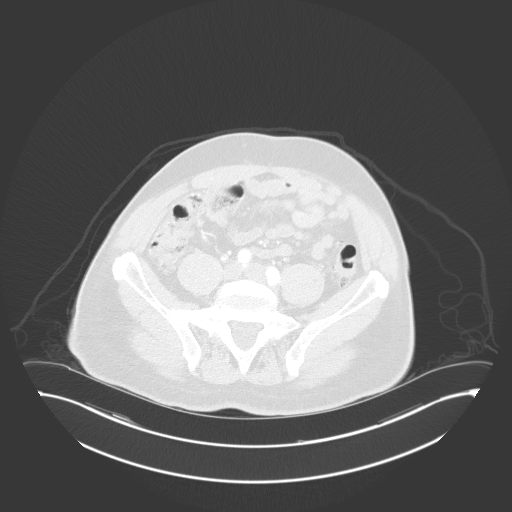
[im 420/473  soft-tissue]
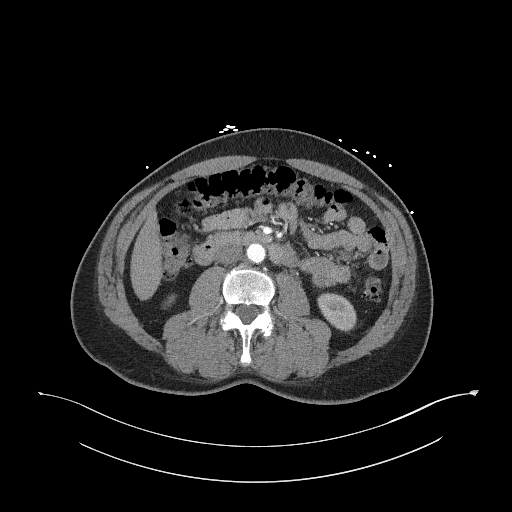
[im 420/473  lung]
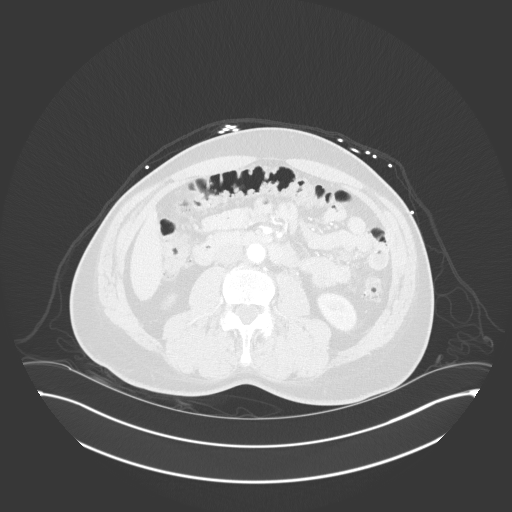
[im 446/473  lung]
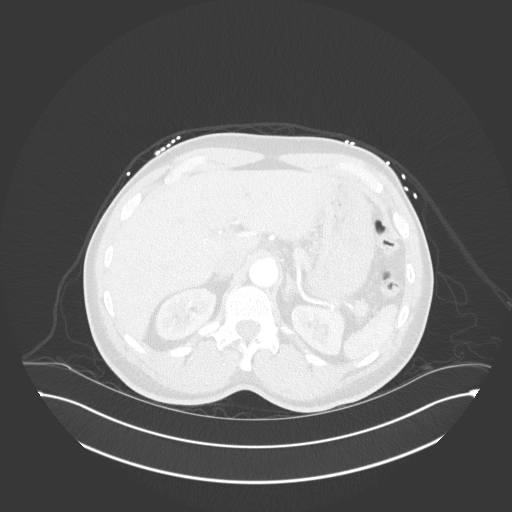

[Series 9: coronal upper · coronal · 0.99mm/px · 1 of 112 slices shown]
[im 56/112  soft-tissue]
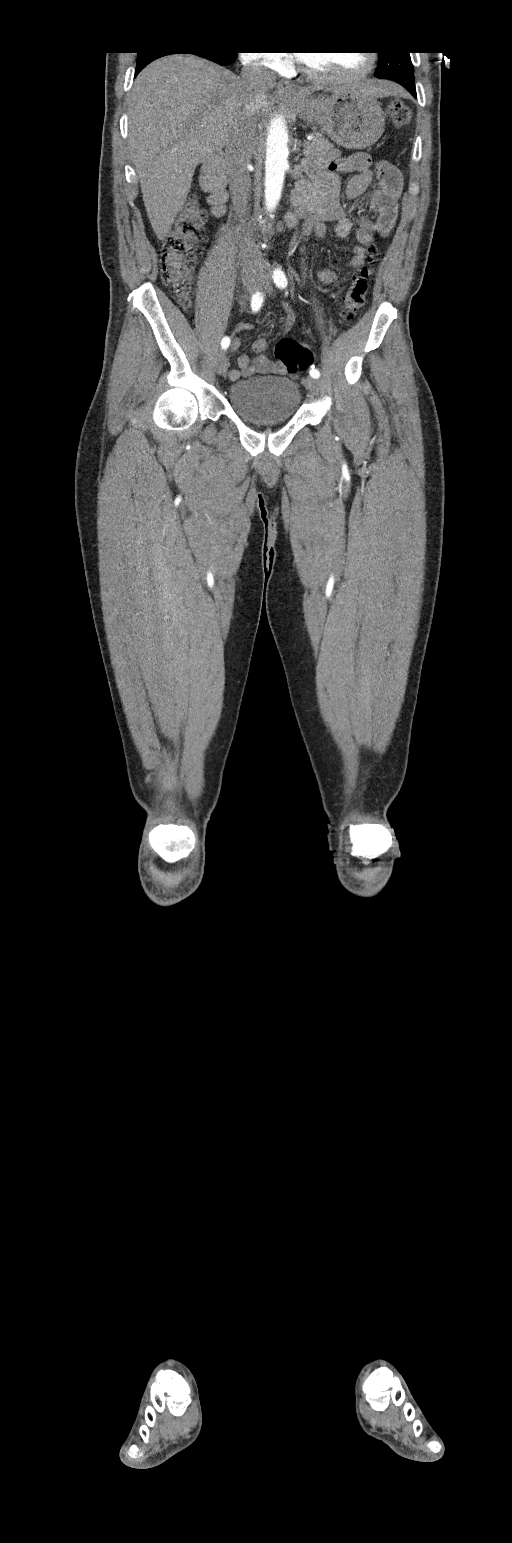

[11 of 46 positions shown; findings below may reference images not displayed]

FINDINGS: VASCULAR

Aorta: Normal caliber aorta without aneurysm, dissection, vasculitis
or significant stenosis.

Celiac: Patent without evidence of aneurysm, dissection, vasculitis
or significant stenosis. There is an accessory left hepatic artery
from left gastric artery.

SMA: Patent without evidence of aneurysm, dissection, vasculitis or
significant stenosis. There is an accessory right hepatic artery
from the SMA.

Renals: Both renal arteries are patent without evidence of aneurysm,
dissection, vasculitis, fibromuscular dysplasia or significant
stenosis.

IMA: Patent without evidence of aneurysm, dissection, vasculitis or
significant stenosis.

RIGHT Lower Extremity

Inflow: Mild atherosclerotic calcification of the iliac vessels. The
internal and external iliac arteries as well as the common iliac
artery are patent.

Outflow: The common femoral artery, superficial and deep femoral
arteries are patent. There is slight irregularity of the distal
superficial femoral artery which may represent mild noncalcified
plaque. The vessel is patent. The popliteal artery and its
trifurcation are patent.

Runoff: Patent three vessel runoff to the ankle. The plantar artery
and dorsalis pedis artery are patent.

LEFT Lower Extremity

Inflow: Mild atherosclerotic plaques. The common, internal, and
external iliac arteries are patent.

Outflow: The common, deep, and superficial femoral arteries patent.
There is irregularity of the distal superficial femoral artery which
may represent mild plaque. The vessel remains patent. The popliteal
artery and its trifurcation are patent.

Runoff: Patent three vessel runoff to the ankle. The plantar artery
is patent. There appears to be diminished flow in the left dorsalis
pedis artery. Evaluation of the arteries of the foot is however
limited on this study.

Veins: No obvious venous abnormality within the limitations of this
arterial phase study.

Review of the MIP images confirms the above findings.

NON-VASCULAR

Lower chest: The visualized lung bases are clear.

No intra-abdominal free air or free fluid.

Hepatobiliary: No focal liver abnormality is seen. No gallstones,
gallbladder wall thickening, or biliary dilatation.

Pancreas: Unremarkable. No pancreatic ductal dilatation or
surrounding inflammatory changes.

Spleen: Normal in size without focal abnormality.

Adrenals/Urinary Tract: The adrenal glands are unremarkable. There
is no hydronephrosis on either side. Subcentimeter hypodense lesion
in the inferior pole of the right kidney is not characterized. The
visualized ureters and urinary bladder appear unremarkable.

Stomach/Bowel: There is sigmoid diverticulosis without active
inflammatory changes. There is no bowel obstruction or active
inflammation. The appendix is normal.

Lymphatic: No adenopathy.

Reproductive: Mild enlargement of the prostate gland. The seminal
vesicles are symmetric.

Other: There is thickened appearance of the soft tissues of the
penis. Clinical correlation is recommended to evaluate for an
inflammatory or infectious process.

Musculoskeletal: Mild degenerative changes of the spine as well as
degenerative changes of the first MTP joint bilaterally. No acute
osseous pathology.
IMPRESSION: 1. Patent three-vessel runoff to the bilateral ankles. There appears
to be diminished flow in the left dorsalis pedis artery. Evaluation
of the arteries of the foot is however limited on this study.
Clinical correlation is recommended.
2. No acute intra-abdominal or pelvic pathology. No CT evidence of
aortic dissection or aneurysm.
3. Thickened appearance of the soft tissues of the penis. Clinical
correlation is recommended to evaluate for an inflammatory or
infectious process.
4. Sigmoid diverticulosis. No bowel obstruction or active
inflammation. Normal appendix.

## 2020-10-08 IMAGING — CT CT NECK W/ CM
3 of 4 series · 13 of 33 positions shown, 16 images · IV contrast (omnipaque)
Comparison: Report of chest CTA 03/26/2014 (no images available).

CLINICAL DATA: 63-year-old male with severe left neck pain after
swallowing a chicken bone.

EXAM:
CT NECK WITH CONTRAST
TECHNIQUE: Multidetector CT imaging of the neck was performed using the
standard protocol following the bolus administration of intravenous
contrast.
CONTRAST:  75mL OMNIPAQUE IOHEXOL 300 MG/ML  SOLN

[Series 6: sag neck · sagittal · 0.73mm/px · 5 of 144 slices shown, 6 images]
[im 48/144  bone]
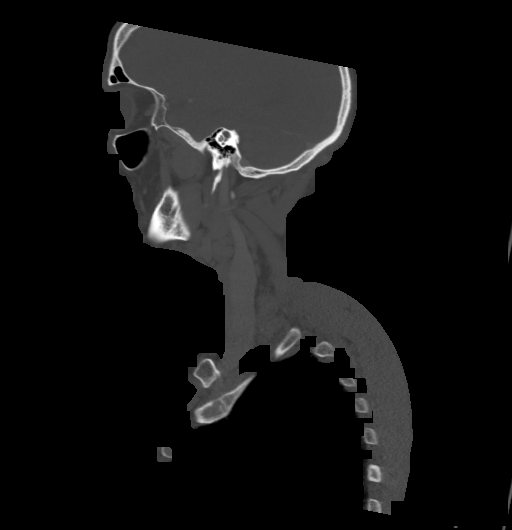
[im 60/144  bone]
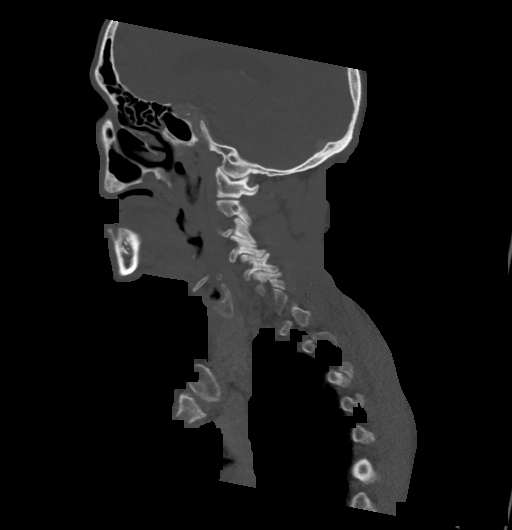
[im 72/144  soft-tissue]
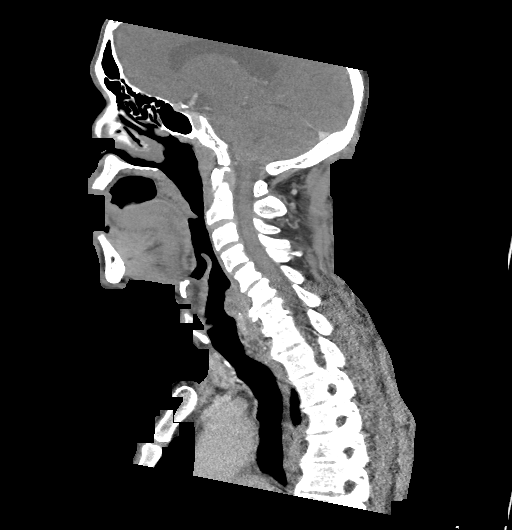
[im 72/144  bone]
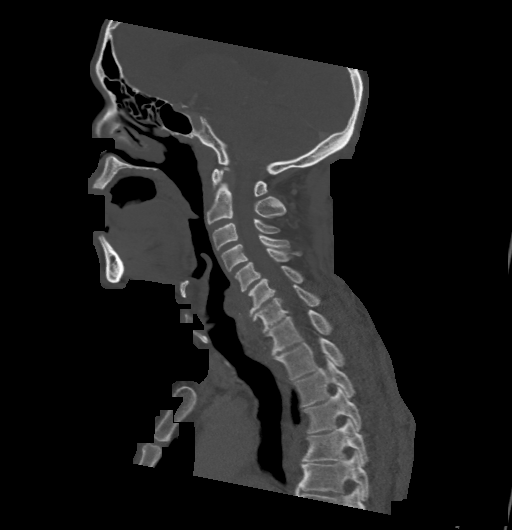
[im 84/144  bone]
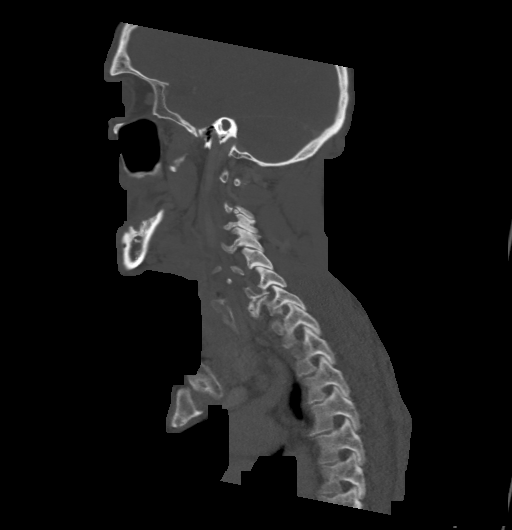
[im 96/144  bone]
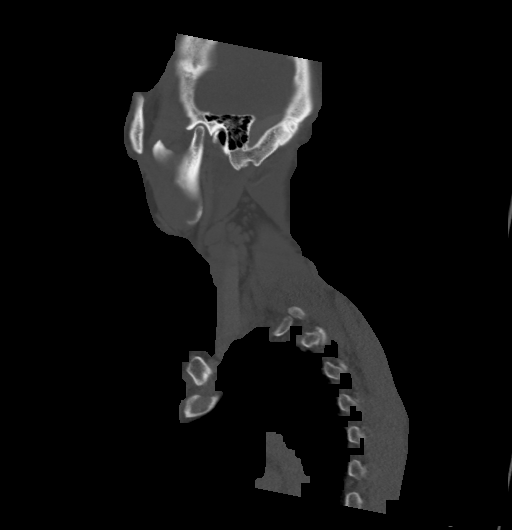

[Series 7: cor neck · coronal · 0.64mm/px · 3 of 104 slices shown]
[im 22/104  bone]
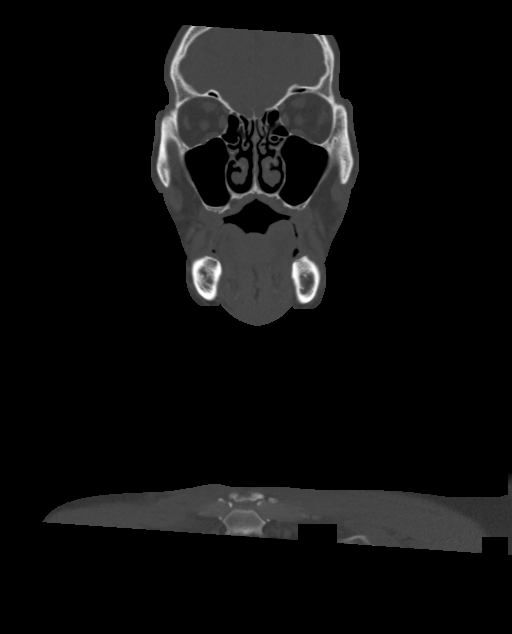
[im 42/104  bone]
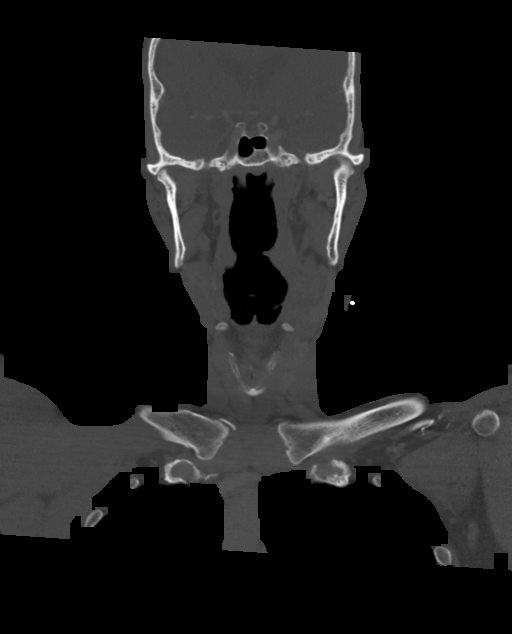
[im 62/104  bone]
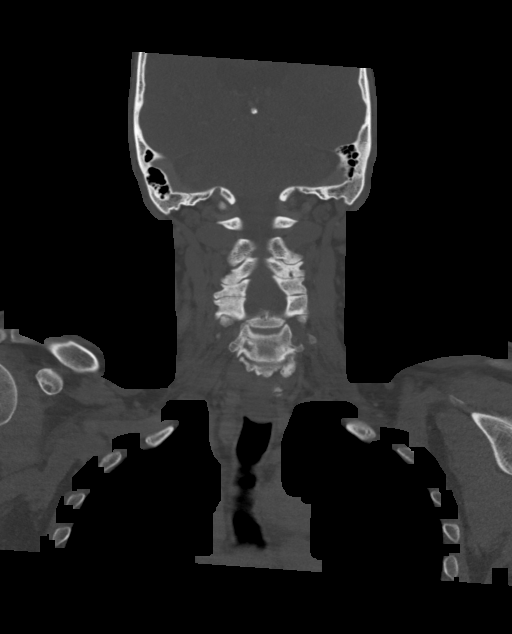

[Series 8: orthogonal ax · axial · 0.55mm/px · z∈[-300,-73]mm · 5 of 177 slices shown, 7 images]
[im 30/177  soft-tissue]
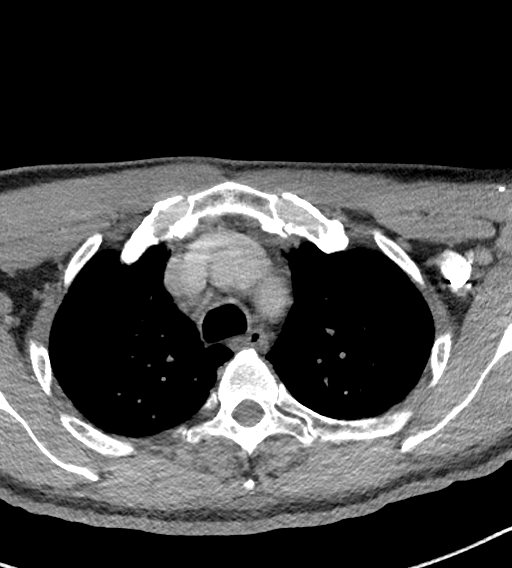
[im 30/177  bone]
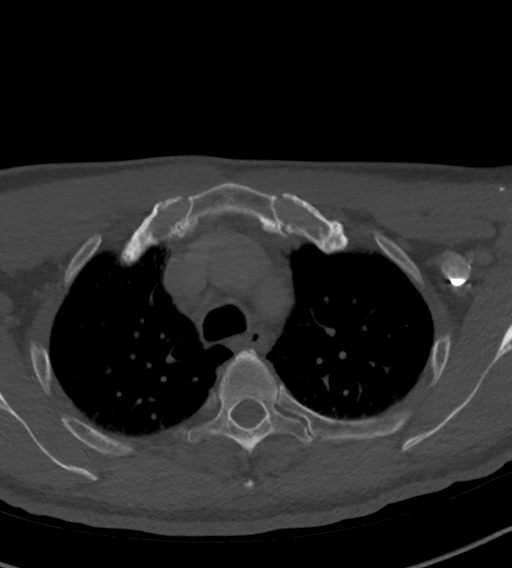
[im 59/177  bone]
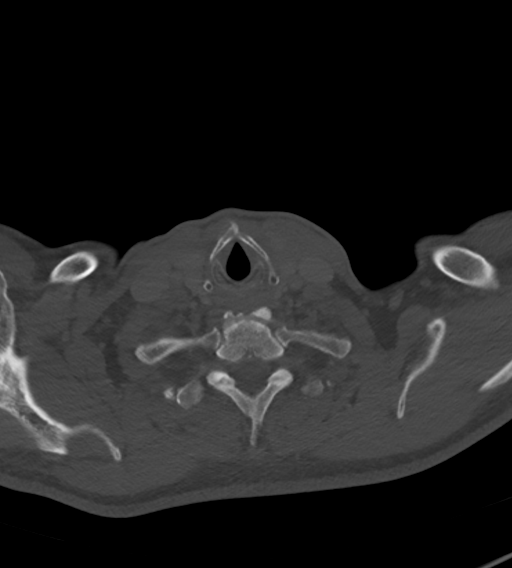
[im 89/177  bone]
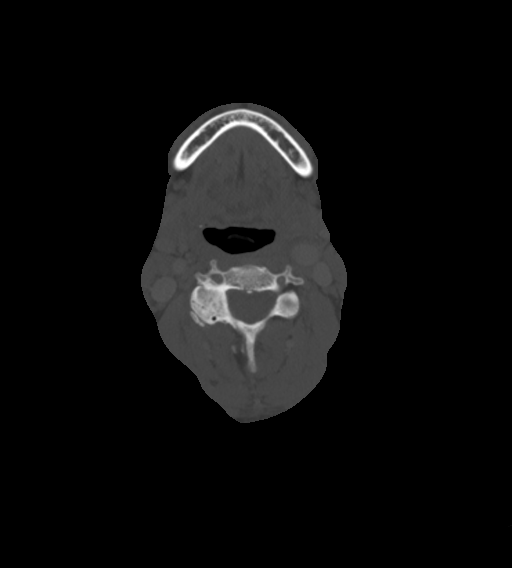
[im 118/177  bone]
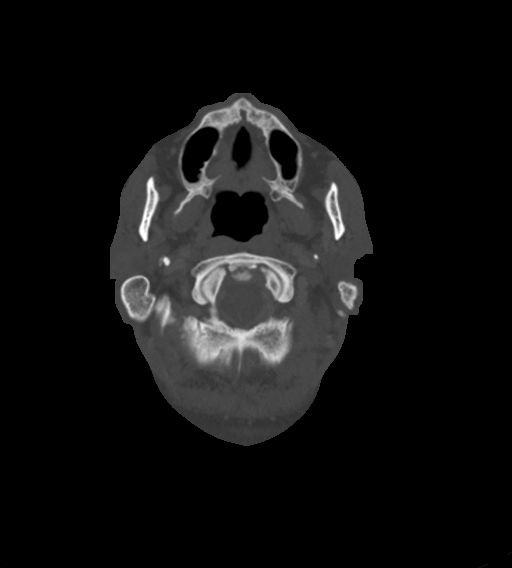
[im 147/177  soft-tissue]
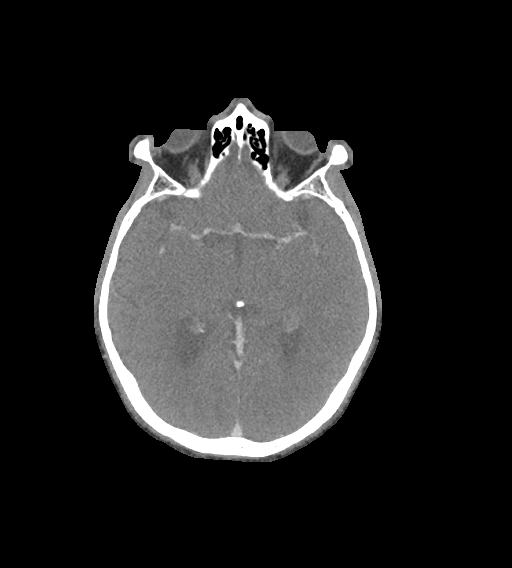
[im 147/177  bone]
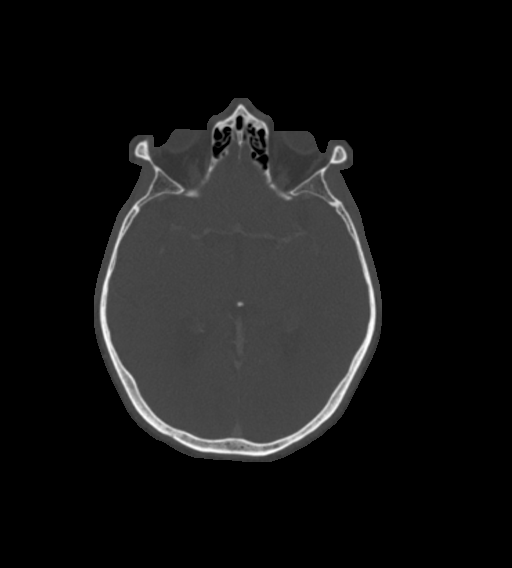

[13 of 33 positions shown; findings below may reference images not displayed]

FINDINGS: Pharynx and larynx: Negative larynx. Pharyngeal soft tissue contours
are also within normal limits. Parapharyngeal and retropharyngeal
spaces are within normal limits.

To possible tiny 3 millimeter linear retained foreign body in the
right vallecula on series 3, image 80 and sagittal image 60. But no
other radiopaque foreign body identified.

Salivary glands: Negative sublingual space. Submandibular glands and
parotid glands are within normal limits.

Thyroid: Negative.

Lymph nodes: An area of clinical concern is marked along the left
neck on series 3, image 78. No regional mass or inflammation. No
cervical lymphadenopathy, bilateral lymph nodes are symmetric and
within normal limits.

Vascular: Suboptimal intravascular contrast bolus but the major
vascular structures in the neck and at the skull base appear patent.

Limited intracranial: Negative.

Visualized orbits: Negative.

Mastoids and visualized paranasal sinuses: Clear.

Skeleton: Absent dentition. Cervical spine degeneration. No acute
osseous abnormality identified.

Upper chest: Mild upper lobe scarring and atelectasis. Negative
visible mediastinum. Negative visible thoracic esophagus. Normal
visible axillary lymph nodes.
IMPRESSION: 1. Possible tiny 3 mm retained chicken bone/foreign body in the
right vallecula (see sagittal image 60).
2. But no other radiopaque foreign body identified. And negative
Neck CT otherwise.
